# Patient Record
Sex: Female | Born: 1960 | Race: White | Hispanic: No | Marital: Married | Smoking: Current every day smoker
Health system: Southern US, Community
[De-identification: ages and names within clinical notes are randomized; demographics above are authoritative.]

## PROBLEM LIST (undated history)

## (undated) DIAGNOSIS — Q351 Cleft hard palate: Secondary | ICD-10-CM

## (undated) DIAGNOSIS — F411 Generalized anxiety disorder: Secondary | ICD-10-CM

## (undated) DIAGNOSIS — C44311 Basal cell carcinoma of skin of nose: Secondary | ICD-10-CM

## (undated) DIAGNOSIS — E669 Obesity, unspecified: Secondary | ICD-10-CM

## (undated) DIAGNOSIS — F329 Major depressive disorder, single episode, unspecified: Secondary | ICD-10-CM

## (undated) DIAGNOSIS — F319 Bipolar disorder, unspecified: Secondary | ICD-10-CM

## (undated) DIAGNOSIS — F32A Depression, unspecified: Secondary | ICD-10-CM

## (undated) HISTORY — DX: Depression, unspecified: F32.A

## (undated) HISTORY — PX: BREAST SURGERY: SHX581

## (undated) HISTORY — PX: CHOLECYSTECTOMY: SHX55

## (undated) HISTORY — DX: Generalized anxiety disorder: F41.1

## (undated) HISTORY — PX: CLEFT PALATE REPAIR: SUR1165

## (undated) HISTORY — DX: Obesity, unspecified: E66.9

## (undated) HISTORY — PX: SKIN CANCER DESTRUCTION: SHX778

## (undated) HISTORY — PX: ABDOMINAL HYSTERECTOMY: SHX81

## (undated) HISTORY — DX: Bipolar disorder, unspecified: F31.9

## (undated) HISTORY — DX: Basal cell carcinoma of skin of nose: C44.311

## (undated) HISTORY — PX: HERNIA REPAIR: SHX51

---
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
## 1999-11-05 ENCOUNTER — Emergency Department (HOSPITAL_COMMUNITY): Admission: EM | Admit: 1999-11-05 | Discharge: 1999-11-05 | Payer: Self-pay | Admitting: Emergency Medicine

## 1999-11-05 ENCOUNTER — Encounter: Payer: Self-pay | Admitting: Emergency Medicine

## 2001-07-03 ENCOUNTER — Emergency Department (HOSPITAL_COMMUNITY): Admission: EM | Admit: 2001-07-03 | Discharge: 2001-07-03 | Payer: Self-pay | Admitting: Emergency Medicine

## 2005-01-07 ENCOUNTER — Ambulatory Visit (HOSPITAL_COMMUNITY): Admission: RE | Admit: 2005-01-07 | Discharge: 2005-01-07 | Payer: Self-pay | Admitting: Obstetrics and Gynecology

## 2005-03-26 ENCOUNTER — Emergency Department (HOSPITAL_COMMUNITY): Admission: EM | Admit: 2005-03-26 | Discharge: 2005-03-27 | Payer: Self-pay | Admitting: Emergency Medicine

## 2006-03-16 ENCOUNTER — Emergency Department (HOSPITAL_COMMUNITY): Admission: EM | Admit: 2006-03-16 | Discharge: 2006-03-16 | Payer: Self-pay | Admitting: Emergency Medicine

## 2006-06-09 ENCOUNTER — Encounter (INDEPENDENT_AMBULATORY_CARE_PROVIDER_SITE_OTHER): Payer: Self-pay | Admitting: Specialist

## 2006-06-09 ENCOUNTER — Inpatient Hospital Stay (HOSPITAL_COMMUNITY): Admission: RE | Admit: 2006-06-09 | Discharge: 2006-06-11 | Payer: Self-pay | Admitting: Obstetrics and Gynecology

## 2006-08-26 ENCOUNTER — Inpatient Hospital Stay (HOSPITAL_COMMUNITY): Admission: AD | Admit: 2006-08-26 | Discharge: 2006-08-26 | Payer: Self-pay | Admitting: Obstetrics and Gynecology

## 2007-03-25 ENCOUNTER — Ambulatory Visit (HOSPITAL_COMMUNITY): Admission: RE | Admit: 2007-03-25 | Discharge: 2007-03-26 | Payer: Self-pay | Admitting: General Surgery

## 2008-07-30 ENCOUNTER — Emergency Department (HOSPITAL_COMMUNITY): Admission: EM | Admit: 2008-07-30 | Discharge: 2008-07-30 | Payer: Self-pay | Admitting: Emergency Medicine
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
## 2010-10-19 ENCOUNTER — Encounter: Payer: Self-pay | Admitting: Obstetrics and Gynecology

## 2010-10-19 ENCOUNTER — Encounter: Payer: Self-pay | Admitting: General Surgery

## 2011-02-10 NOTE — Op Note (Signed)
NAMEALVERA, TOURIGNY              ACCOUNT NO.:  [id number]   MEDICAL RECORD NO.:  [id number]          PATIENT TYPE:  OIB   LOCATION:  0098                         FACILITY:  [facility]   PHYSICIAN:  Angelia Mould. Derrell Lolling, M.D.DATE OF BIRTH:  [birth year]   DATE OF PROCEDURE:  03/25/2007  DATE OF DISCHARGE:                               OPERATIVE REPORT   PREOPERATIVE DIAGNOSIS:  Ventral incisional hernia.   POSTOPERATIVE DIAGNOSIS:  Ventral incisional hernia.   OPERATION PERFORMED:  Laparoscopic repair of ventral incisional hernia  with Parietex composite mesh (15 cm x 20 cm dimension).   SURGEON:  Angelia Mould. Derrell Lolling, M.D.   FIRST ASSISTANT:  Ollen Gross. Vernell Morgans, M.D.   OPERATIVE INDICATIONS:  This is a 50 year old white female who has had a  tubal ligation laparoscopically, a laparoscopic-assisted vaginal  hysterectomy and many years ago had an open cholecystectomy in 1985.  She has developed a symptomatic hernia centrally in the periumbilical  area which is painful and seems to have a defect about 3-4 cm in size.  There is no hernia in the right subcostal incision.  She is brought to  the operating room electively for repair of her periumbilical ventral  incisional hernia.   OPERATIVE TECHNIQUE:  Following induction of general endotracheal  anesthesia, the Foley catheter was inserted to empty the bladder.  Intravenous antibiotics were given.  The abdomen was prepped and draped  in sterile fashion.  The patient was identified as to correct patient  and correct procedure.  A 12 mm OptiVu port was placed in the left upper  quadrant.  This placement was uneventful.  Pneumoperitoneum was created.  Video camera was inserted.  There was no evidence of any bleeding or  trauma from this port insertion.  We ultimately placed a 5 mm trocar in  the left lower quadrant and two 5 mm trocars on the right lateral  abdominal wall.  On survey of the abdomen there was no abnormalities  other than about 4 cm  diameter incisional hernia centered around the  umbilicus into the right of the umbilicus.  There were a few adhesions  which were taken down.  We marked the edges of the hernia rim with a  spinal needle and then measured about 4 cm outside of that on all  dimensions and that gave [place] a 15 x 15 cm template.  We used a 15 cm x 20  cm piece of Parietex composite mesh.  This was brought to the operative  field.  This was placed in the abdominal wall and we drew a template for  suture fixation with six equidistant suture fixation sites around the  perimeter of the mesh.  We placed six interrupted sutures of #1 Novofil  at the predesignated markings.  We then moisten the mesh, rolled it up  and inserted into the abdominal cavity.  We opened the mesh up and  spread it out in the orientation that was marked.  Through tiny puncture  wounds, we drew the sutures through the abdominal wall at the six suture  fixation sites, being careful to get a 1  cm bite of fascia.  After all  the sutures were placed, we lifted the mesh up and we had good coverage  and almost no redundancy or draping of the mesh.  We tied all of these  sutures.  We then further secured the mesh to the abdominal wall with a  5 mm screw tacker.  We placed the screw tacks at the perimeter of the  mesh all the way around very close each other no more than 1 cm apart.  We placed several tacks centrally as well.  We had one area where the  screw tack cause bleeding in the right lower quadrant and we held  pressure on this and the bleeding seemed to stop.  I chose to place a  figure-of-eight suture of 0 Vicryl around this to be sure that it  stopped the bleeding and all the bleeding appeared to completely stop.  We checked everything that we did, everything seemed secure.  There were  no signs of any injury to any bowel.  The trocars were removed under  direct vision after releasing all of the pneumoperitoneum.  All the skin  incisions  were closed with subcuticular sutures of 4-0 Monocryl and  Steri-Strips.  Clean bandages were placed and the patient taken to  recovery room in stable condition.   ESTIMATED BLOOD LOSS:  About 15 mL.   COMPLICATIONS:  None.   Sponge, needle and instrument counts were correct.      Angelia Mould. Derrell Lolling, M.D.  Electronically Signed     HMI/MEDQ  D:  03/25/2007  T:  03/25/2007  Job:  [id number]   cc:   Malva Limes, M.D.  Fax: [phone]   Lenor Coffin, M.D.

## 2011-02-13 NOTE — Discharge Summary (Signed)
Pamela Collins, Pamela Collins              ACCOUNT NO.:  [id number]   MEDICAL RECORD NO.:  [id number]          PATIENT TYPE:  INP   LOCATION:  9317                          FACILITY:  [facility]   PHYSICIAN:  Malva Limes, M.D.    DATE OF BIRTH:  1961-04-19   DATE OF ADMISSION:  06/09/2006  DATE OF DISCHARGE:  06/11/2006                                 DISCHARGE SUMMARY   DISCHARGE DIAGNOSES:  1. Chronic pelvic pain.  2. Dyspareunia.  3. Dysmenorrhea.   PRINCIPAL PROCEDURES:  1. Laparoscopic assisted vaginal hysterectomy with bilateral salpingo-      oophorectomy.  2. Excision of a vulvar mass.   HISTORY OF PRESENT ILLNESS:  [name] is a 50 year old white female, G5,  P3-0-2-3 who presented to [facility] on June 09, 2006, for  laparoscopic assisted vaginal hysterectomy with bilateral salpingo-  oophorectomy and excision of a right vulvar mass.  The patient was having  this procedure secondary to a history of chronic pelvic pain, dyspareunia  and dysmenorrhea.  The patient was offered to a diagnostic laparoscopy and  declined.   HOSPITAL COURSE:  The patient underwent the above-listed procedures on  June 09, 2006, the complete description of this can be found in the  dictated operative note.  The patient's postoperative course was  uncomplicated.  The patient was discharged to home on postoperative day #2.  At that time she was eating a regular diet and ambulating without  difficulty.  The patient remained afebrile throughout the hospitalization.  She was noted to have a hematoma in the area of excision of the right vulvar  mass.  This will be followed up in the office.  The patient's pathology  revealed endocervical polyp and koilocytotic atypia of the cervix.  There  was also benign endometrial polyp in the endometrium.  The excised mass of  the vulva was a benign epidermal inclusion cyst.  The patient's preoperative  hemoglobin was 16.6 and postop 14.3.  The patient was  discharged to home  with Percocet to take as needed and Estrace 1 mg p.o. q.a.m.  She will  follow-up in the office in 4-6 days at which time the hematoma will be  evaluated.           ______________________________  Malva Limes, M.D.     MA/MEDQ  D:  07/12/2006  T:  07/13/2006  Job:  [id number]

## 2011-02-13 NOTE — Op Note (Signed)
Pamela Collins, Pamela Collins              ACCOUNT NO.:  [id number]   MEDICAL RECORD NO.:  [id number]          PATIENT TYPE:  INP   LOCATION:  9317                          FACILITY:  [facility]   PHYSICIAN:  Malva Limes, M.D.    DATE OF BIRTH:  1961-05-09   DATE OF PROCEDURE:  06/09/2006  DATE OF DISCHARGE:                                 OPERATIVE REPORT   PREOPERATIVE DIAGNOSIS:  1. Chronic pelvic pain.  2. Dyspareunia.  3. Dysmenorrhea.  4. Right labial mass.   POSTOP DIAGNOSIS:  1. Chronic pelvic pain.  2. Dyspareunia.  3. Dysmenorrhea.  4. Right labial mass.   PROCEDURE:  1. Laparoscopic-assisted vaginal hysterectomy, bilateral salpingo-      oophorectomy.  2. Excision of right vulvar mass.   SURGEON:  Dr. Dareen Piano   ASSISTANT:  Dr. Edward Jolly   ANESTHESIA:  General endotracheal.   ANTIBIOTICS:  Ancef 1 gram.   DRAINS:  Foley to bedside drainage   ESTIMATED BLOOD LOSS:  150 mL.   SPECIMENS:  Cervix and uterus, fallopian tubes and ovaries along with the  vulvar mass sent to pathology.   PROCEDURE:  The patient was taken to the operating room where she was placed  in the dorsal supine position and general anesthetic was administered  without complication.  She was then placed in dorsal lithotomy position.  She was prepped with Betadine and draped in the usual fashion for this  procedure.  The Hulka tenaculum applied to the anterior cervical lip.  Bladder was drained with red rubber catheter.  The umbilicus was injected  with 0.25% Marcaine. Vertical skin incision was made.  This was carried down  to fascia.  The fascia was grasped, entered, the parietal peritoneum was  then entered bluntly.  Sutures were placed laterally.  The Hasson cannula  was placed in the peritoneal cavity and 3 liters of carbon dioxide was  insufflated.  The patient was then placed Trendelenburg.  5-mm ports were  placed in the right and left lower quadrant under direct visualization.  Once this  accomplished the pelvis was examined.  There appeared to be no  evidence of any pelvic adhesions or endometriosis.  At this point the left  adnexa was grasped and infundibulopelvic ligament isolated, ureter  identified. Using the tripolar the infundibulopelvic ligament was then  transected. The round ligament was then cauterized and transected and the  superior aspect the broad ligament cauterized and transected to the level of  the uterine vessel. Similar procedure was performed on the opposite side. At  this point attention was turned to the vagina.  The posterior cul-de-sac was  entered sharply.  Uterosacral ligaments were bilaterally clamped, cut and  ligated with 0 Monocryl suture.  The cervix was then circumscribed, the  bladder pillars were bilaterally clamped, cut and ligated with 0 Monocryl  suture.  Cardinal ligaments were serially clamped, cut, ligated and ligated  with 0 Monocryl suture. Uterine vessels were bilaterally clamped, cut and  ligated with 0 Monocryl suture.  The uterus was then inverted and the  remaining broad ligament pedicles were bilaterally clamped, cut, and ligated  with 0 Monocryl suture.  The specimen removed and sent to pathology.  At  this point, pedicles were all checked and found to be hemostatic.  Following  this the vagina was closed using 2-0 Vicryl in a running locking fashion in  a vertical manner. At this point the mass on the right vulva was examined  and appeared to be approximately 2-3 cm in length and 1 cm in width. A  vertical skin incision was made.  The mass was excised and the skin incision  closed with 3-0 Rapide. At this point attention was placed back on the  umbilicus.  The abdominal cavity was insufflated again, pedicles were  checked and found to be hemostatic. At this point instruments were removed.  Pneumoperitoneum released.  The fascia was closed with interrupted 0 Vicryl  suture, then the skin with 3-0 Rapide. The 5 mm ports were  closed with  Dermabond.  The patient tolerated the procedure well.  She was extubated and  taken to the recovery room in stable condition.  Instrument and laparoscopy  counts correct x2.           ______________________________  Malva Limes, M.D.     MA/MEDQ  D:  06/09/2006  T:  06/10/2006  Job:  [id number]

## 2011-07-15 LAB — URINALYSIS, ROUTINE W REFLEX MICROSCOPIC
Bilirubin Urine: NEGATIVE
Glucose, UA: NEGATIVE
Nitrite: NEGATIVE
Protein, ur: NEGATIVE
Specific Gravity, Urine: 1.015

## 2011-07-15 LAB — DIFFERENTIAL
Eosinophils Relative: 3
Monocytes Absolute: 0.4
Neutrophils Relative %: 68

## 2011-07-15 LAB — COMPREHENSIVE METABOLIC PANEL
Albumin: 3.9
BUN: 11
CO2: 32
Calcium: 9.8
Chloride: 100
Creatinine, Ser: 0.66
GFR calc Af Amer: >60
Glucose, Bld: 95
Sodium: 142
Total Bilirubin: 0.6

## 2011-07-15 LAB — CBC
HCT: 50.5 — ABNORMAL HIGH
Hemoglobin: 17 — ABNORMAL HIGH
MCHC: 33.7
MCV: 88.1
RDW: 13.7

## 2019-03-27 ENCOUNTER — Other Ambulatory Visit: Payer: Self-pay

## 2019-03-27 ENCOUNTER — Ambulatory Visit
Admission: EM | Admit: 2019-03-27 | Discharge: 2019-03-27 | Disposition: A | Discharge status: Home / Self Care | Payer: Self-pay

## 2019-03-27 ENCOUNTER — Encounter: Payer: Self-pay | Admitting: Emergency Medicine

## 2019-03-27 DIAGNOSIS — F17200 Nicotine dependence, unspecified, uncomplicated: Secondary | ICD-10-CM

## 2019-03-27 DIAGNOSIS — J320 Chronic maxillary sinusitis: Secondary | ICD-10-CM

## 2019-03-27 DIAGNOSIS — B9689 Other specified bacterial agents as the cause of diseases classified elsewhere: Secondary | ICD-10-CM

## 2019-03-27 HISTORY — DX: Cleft hard palate: Q35.1

## 2019-03-27 MED ORDER — AMOXICILLIN-POT CLAVULANATE 875-125 MG PO TABS: 1.0000 | ORAL_TABLET | Freq: Two times a day (BID) | ORAL | 0 refills | Status: AC

## 2019-03-27 NOTE — ED Triage Notes (Signed)
Pt presents to [facility] for assessment of nasal congestion, chest congestion and productive cough (clear phlegm), head pressure.  Pt states Sudafed helps relieve most of her symptoms.  Pt states in the last 48 hours she has began having more congestion and pressure.

## 2019-03-27 NOTE — ED Provider Notes (Signed)
[facility]    CSN: [id number] Arrival date & time: 03/27/19  1352      History   Chief Complaint Chief Complaint  Patient presents with  . URI    HPI Angeni Chaudhuri is a 58 y.o. female.   The history is provided by the patient. No language interpreter was used.  URI Presenting symptoms: congestion, facial pain and rhinorrhea   Severity:  Moderate Onset quality:  Gradual Timing:  Constant Progression:  Worsening Relieved by:  Nothing Worsened by:  Nothing Ineffective treatments:  None tried Associated symptoms: sinus pain   Pt complains of sinus pressure and pain.  Pt states she has had symptoms for months.  Pt also states she is out of xanax and her MD retired.   Past Medical History:  Diagnosis Date  . Anxiety   . Cleft hard palate     There are no active problems to display for this patient.   Past Surgical History:  Procedure Laterality Date  . ABDOMINAL HYSTERECTOMY    . BREAST SURGERY    . CHOLECYSTECTOMY    . CLEFT PALATE REPAIR    . HERNIA REPAIR      OB History   No obstetric history on file.      Home Medications    Prior to Admission medications   Medication Sig Start Date End Date Taking? Authorizing Provider  ALPRAZolam Duanne Moron) 1 MG tablet Take 1 mg by mouth at bedtime as needed for anxiety.    [provider]  amoxicillin-clavulanate (AUGMENTIN) 875-125 MG tablet Take 1 tablet by mouth 2 (two) times daily for 10 days. 03/27/19 04/06/19  Fransico Meadow, PA-C    Family History History reviewed. No pertinent family history.  Social History Social History   Tobacco Use  . Smoking status: Current Every Day Smoker    Packs/day: 1.00  . Smokeless tobacco: Never Used  Substance Use Topics  . Alcohol use: Never    Frequency: Never  . Drug use: Never     Allergies   Doxycycline and Mucinex [guaifenesin er]   Review of Systems Review of Systems  HENT: Positive for congestion, rhinorrhea and sinus  pain.   All other systems reviewed and are negative.    Physical Exam Triage Vital Signs ED Triage Vitals  Enc Vitals Group     BP 03/27/19 1405 (!) 149/107     Pulse Rate 03/27/19 1405 100     Resp 03/27/19 1405 20     Temp 03/27/19 1405 (!) 97.2 F (36.2 C)     Temp Source 03/27/19 1405 Temporal     SpO2 03/27/19 1405 91 %     Weight --      Height --      Head Circumference --      Peak Flow --      Pain Score 03/27/19 1406 0     Pain Loc --      Pain Edu? --      Excl. in [place]? --    No data found.  Updated Vital Signs BP (!) 149/107 (BP Location: Left Arm)   Pulse 100   Temp (!) 97.2 F (36.2 C) (Temporal) Comment: 101.9, took Tylenol approx 3am  Resp 20   SpO2 91%   Visual Acuity Right Eye Distance:   Left Eye Distance:   Bilateral Distance:    Right Eye Near:   Left Eye Near:    Bilateral Near:     Physical Exam Constitutional:  Appearance: She is well-developed.  HENT:     Head: Normocephalic and atraumatic.     Right Ear: Tympanic membrane normal.     Left Ear: Tympanic membrane normal.     Nose: Nose normal.     Mouth/Throat:     Mouth: Mucous membranes are moist.     Pharynx: Posterior oropharyngeal erythema present.  Eyes:     Conjunctiva/sclera: Conjunctivae normal.     Pupils: Pupils are equal, round, and reactive to light.  Neck:     Musculoskeletal: Normal range of motion and neck supple.  Cardiovascular:     Rate and Rhythm: Normal rate.  Pulmonary:     Effort: Pulmonary effort is normal.  Abdominal:     Palpations: Abdomen is soft.  Musculoskeletal: Normal range of motion.  Skin:    General: Skin is warm and dry.  Neurological:     General: No focal deficit present.     Mental Status: She is alert and oriented to person, place, and time.  Psychiatric:        Mood and Affect: Mood normal.      UC Treatments / Results  Labs (all labs ordered are listed, but only abnormal results are displayed) Labs Reviewed - No data  to display  EKG None  Radiology No results found.  Procedures Procedures (including critical care time)  Medications Ordered in UC Medications - No data to display  Initial Impression / Assessment and Plan / UC Course  I have reviewed the triage vital signs and the nursing notes.  Pertinent labs & imaging results that were available during my care of the patient were reviewed by me and considered in my medical decision making (see chart for details).     Pt given referral to primary care for assessment and medication management.   Final Clinical Impressions(s) / UC Diagnoses   Final diagnoses:  Chronic maxillary sinusitis     Discharge Instructions     Return if any problems.    ED Prescriptions    Medication Sig Dispense Auth. Provider   amoxicillin-clavulanate (AUGMENTIN) 875-125 MG tablet Take 1 tablet by mouth 2 (two) times daily for 10 days. 20 tablet Fransico Meadow, [place]     Controlled Substance Prescriptions [place] Controlled Substance Registry consulted? Not Applicable   Fransico Meadow, [place] 03/27/19 1529

## 2019-03-27 NOTE — Discharge Instructions (Signed)
Return if any problems.

## 2019-03-27 NOTE — ED Notes (Signed)
Patient able to ambulate independently  

## 2019-04-07 ENCOUNTER — Encounter: Payer: Self-pay | Admitting: Family Medicine

## 2019-04-10 ENCOUNTER — Inpatient Hospital Stay: Payer: Self-pay | Admitting: Family Medicine

## 2022-01-28 DIAGNOSIS — R69 Illness, unspecified: Secondary | ICD-10-CM | POA: Diagnosis not present

## 2022-02-05 DIAGNOSIS — R69 Illness, unspecified: Secondary | ICD-10-CM | POA: Diagnosis not present

## 2022-03-02 DIAGNOSIS — R69 Illness, unspecified: Secondary | ICD-10-CM | POA: Diagnosis not present

## 2022-03-05 DIAGNOSIS — R69 Illness, unspecified: Secondary | ICD-10-CM | POA: Diagnosis not present

## 2022-03-09 DIAGNOSIS — R69 Illness, unspecified: Secondary | ICD-10-CM | POA: Diagnosis not present

## 2022-03-12 DIAGNOSIS — R69 Illness, unspecified: Secondary | ICD-10-CM | POA: Diagnosis not present

## 2022-03-16 DIAGNOSIS — R69 Illness, unspecified: Secondary | ICD-10-CM | POA: Diagnosis not present

## 2022-03-19 DIAGNOSIS — R69 Illness, unspecified: Secondary | ICD-10-CM | POA: Diagnosis not present

## 2022-03-23 DIAGNOSIS — R69 Illness, unspecified: Secondary | ICD-10-CM | POA: Diagnosis not present

## 2022-03-26 DIAGNOSIS — R69 Illness, unspecified: Secondary | ICD-10-CM | POA: Diagnosis not present

## 2022-04-07 DIAGNOSIS — R69 Illness, unspecified: Secondary | ICD-10-CM | POA: Diagnosis not present

## 2022-04-13 DIAGNOSIS — R69 Illness, unspecified: Secondary | ICD-10-CM | POA: Diagnosis not present

## 2022-04-20 DIAGNOSIS — R69 Illness, unspecified: Secondary | ICD-10-CM | POA: Diagnosis not present

## 2022-05-04 DIAGNOSIS — R69 Illness, unspecified: Secondary | ICD-10-CM | POA: Diagnosis not present

## 2022-05-11 DIAGNOSIS — R69 Illness, unspecified: Secondary | ICD-10-CM | POA: Diagnosis not present

## 2022-05-21 DIAGNOSIS — R69 Illness, unspecified: Secondary | ICD-10-CM | POA: Diagnosis not present

## 2022-05-25 DIAGNOSIS — R69 Illness, unspecified: Secondary | ICD-10-CM | POA: Diagnosis not present

## 2022-06-09 ENCOUNTER — Other Ambulatory Visit: Payer: Self-pay | Admitting: Internal Medicine

## 2022-06-09 ENCOUNTER — Ambulatory Visit
Admission: RE | Admit: 2022-06-09 | Discharge: 2022-06-09 | Disposition: A | Discharge status: Home / Self Care | Payer: 59 | Source: Ambulatory Visit | Attending: Internal Medicine | Admitting: Internal Medicine

## 2022-06-09 DIAGNOSIS — R946 Abnormal results of thyroid function studies: Secondary | ICD-10-CM | POA: Diagnosis not present

## 2022-06-09 DIAGNOSIS — R0602 Shortness of breath: Secondary | ICD-10-CM

## 2022-06-11 DIAGNOSIS — R69 Illness, unspecified: Secondary | ICD-10-CM | POA: Diagnosis not present

## 2022-06-17 ENCOUNTER — Other Ambulatory Visit (HOSPITAL_COMMUNITY): Payer: Self-pay | Admitting: Radiology

## 2022-06-17 DIAGNOSIS — R0602 Shortness of breath: Secondary | ICD-10-CM

## 2022-06-22 DIAGNOSIS — R69 Illness, unspecified: Secondary | ICD-10-CM | POA: Diagnosis not present

## 2022-07-01 ENCOUNTER — Ambulatory Visit
Admission: RE | Admit: 2022-07-01 | Discharge: 2022-07-01 | Disposition: A | Discharge status: Home / Self Care | Payer: 59 | Source: Ambulatory Visit | Attending: Internal Medicine | Admitting: Internal Medicine

## 2022-07-01 ENCOUNTER — Other Ambulatory Visit: Payer: Self-pay | Admitting: Internal Medicine

## 2022-07-01 DIAGNOSIS — S0033XA Contusion of nose, initial encounter: Secondary | ICD-10-CM | POA: Diagnosis not present

## 2022-07-01 DIAGNOSIS — K529 Noninfective gastroenteritis and colitis, unspecified: Secondary | ICD-10-CM | POA: Diagnosis not present

## 2022-07-01 DIAGNOSIS — S0992XA Unspecified injury of nose, initial encounter: Secondary | ICD-10-CM

## 2022-07-03 ENCOUNTER — Other Ambulatory Visit: Payer: Self-pay | Admitting: Internal Medicine

## 2022-07-03 DIAGNOSIS — Z1231 Encounter for screening mammogram for malignant neoplasm of breast: Secondary | ICD-10-CM

## 2022-07-06 ENCOUNTER — Inpatient Hospital Stay (HOSPITAL_COMMUNITY): Admission: RE | Admit: 2022-07-06 | Payer: 59 | Source: Ambulatory Visit

## 2022-07-13 DIAGNOSIS — R059 Cough, unspecified: Secondary | ICD-10-CM | POA: Diagnosis not present

## 2022-07-13 DIAGNOSIS — J029 Acute pharyngitis, unspecified: Secondary | ICD-10-CM | POA: Diagnosis not present

## 2022-07-13 DIAGNOSIS — Z03818 Encounter for observation for suspected exposure to other biological agents ruled out: Secondary | ICD-10-CM | POA: Diagnosis not present

## 2022-07-16 ENCOUNTER — Inpatient Hospital Stay (HOSPITAL_COMMUNITY): Admission: RE | Admit: 2022-07-16 | Payer: 59 | Source: Ambulatory Visit

## 2022-08-03 ENCOUNTER — Ambulatory Visit
Admission: RE | Admit: 2022-08-03 | Discharge: 2022-08-03 | Disposition: A | Discharge status: Home / Self Care | Payer: 59 | Source: Ambulatory Visit | Attending: Internal Medicine | Admitting: Internal Medicine

## 2022-08-03 DIAGNOSIS — Z1231 Encounter for screening mammogram for malignant neoplasm of breast: Secondary | ICD-10-CM

## 2022-08-28 DIAGNOSIS — Z419 Encounter for procedure for purposes other than remedying health state, unspecified: Secondary | ICD-10-CM | POA: Diagnosis not present

## 2022-09-28 DIAGNOSIS — Z419 Encounter for procedure for purposes other than remedying health state, unspecified: Secondary | ICD-10-CM | POA: Diagnosis not present

## 2022-09-29 DIAGNOSIS — F4310 Post-traumatic stress disorder, unspecified: Secondary | ICD-10-CM | POA: Diagnosis not present

## 2022-10-01 ENCOUNTER — Telehealth: Payer: Self-pay

## 2022-10-01 NOTE — Telephone Encounter (Signed)
Mychart msg sent. AS, CMA 

## 2022-10-13 DIAGNOSIS — F4310 Post-traumatic stress disorder, unspecified: Secondary | ICD-10-CM | POA: Diagnosis not present

## 2022-10-20 DIAGNOSIS — F4310 Post-traumatic stress disorder, unspecified: Secondary | ICD-10-CM | POA: Diagnosis not present

## 2022-10-27 DIAGNOSIS — F411 Generalized anxiety disorder: Secondary | ICD-10-CM | POA: Diagnosis not present

## 2022-10-29 DIAGNOSIS — Z419 Encounter for procedure for purposes other than remedying health state, unspecified: Secondary | ICD-10-CM | POA: Diagnosis not present

## 2022-11-03 DIAGNOSIS — F4310 Post-traumatic stress disorder, unspecified: Secondary | ICD-10-CM | POA: Diagnosis not present

## 2022-11-09 DIAGNOSIS — H6123 Impacted cerumen, bilateral: Secondary | ICD-10-CM | POA: Diagnosis not present

## 2022-11-09 DIAGNOSIS — Q359 Cleft palate, unspecified: Secondary | ICD-10-CM | POA: Diagnosis not present

## 2022-11-09 DIAGNOSIS — J329 Chronic sinusitis, unspecified: Secondary | ICD-10-CM | POA: Diagnosis not present

## 2022-11-10 DIAGNOSIS — F4310 Post-traumatic stress disorder, unspecified: Secondary | ICD-10-CM | POA: Diagnosis not present

## 2022-11-11 DIAGNOSIS — J321 Chronic frontal sinusitis: Secondary | ICD-10-CM | POA: Diagnosis not present

## 2022-11-11 DIAGNOSIS — J320 Chronic maxillary sinusitis: Secondary | ICD-10-CM | POA: Diagnosis not present

## 2022-11-11 DIAGNOSIS — S022XXA Fracture of nasal bones, initial encounter for closed fracture: Secondary | ICD-10-CM | POA: Diagnosis not present

## 2022-11-11 DIAGNOSIS — J342 Deviated nasal septum: Secondary | ICD-10-CM | POA: Diagnosis not present

## 2022-11-11 DIAGNOSIS — J329 Chronic sinusitis, unspecified: Secondary | ICD-10-CM | POA: Diagnosis not present

## 2022-11-17 DIAGNOSIS — F4310 Post-traumatic stress disorder, unspecified: Secondary | ICD-10-CM | POA: Diagnosis not present

## 2022-11-24 DIAGNOSIS — F4310 Post-traumatic stress disorder, unspecified: Secondary | ICD-10-CM | POA: Diagnosis not present

## 2022-11-25 DIAGNOSIS — M62830 Muscle spasm of back: Secondary | ICD-10-CM | POA: Diagnosis not present

## 2022-11-25 DIAGNOSIS — L0390 Cellulitis, unspecified: Secondary | ICD-10-CM | POA: Diagnosis not present

## 2022-11-27 DIAGNOSIS — Z419 Encounter for procedure for purposes other than remedying health state, unspecified: Secondary | ICD-10-CM | POA: Diagnosis not present

## 2022-12-01 DIAGNOSIS — F4310 Post-traumatic stress disorder, unspecified: Secondary | ICD-10-CM | POA: Diagnosis not present

## 2022-12-22 DIAGNOSIS — F4310 Post-traumatic stress disorder, unspecified: Secondary | ICD-10-CM | POA: Diagnosis not present

## 2022-12-28 DIAGNOSIS — Z419 Encounter for procedure for purposes other than remedying health state, unspecified: Secondary | ICD-10-CM | POA: Diagnosis not present

## 2022-12-29 DIAGNOSIS — F4310 Post-traumatic stress disorder, unspecified: Secondary | ICD-10-CM | POA: Diagnosis not present

## 2023-01-05 DIAGNOSIS — F4310 Post-traumatic stress disorder, unspecified: Secondary | ICD-10-CM | POA: Diagnosis not present

## 2023-01-08 DIAGNOSIS — Z13220 Encounter for screening for lipoid disorders: Secondary | ICD-10-CM | POA: Diagnosis not present

## 2023-01-08 DIAGNOSIS — K529 Noninfective gastroenteritis and colitis, unspecified: Secondary | ICD-10-CM | POA: Diagnosis not present

## 2023-01-12 ENCOUNTER — Other Ambulatory Visit: Payer: Self-pay | Admitting: Internal Medicine

## 2023-01-12 DIAGNOSIS — F17200 Nicotine dependence, unspecified, uncomplicated: Secondary | ICD-10-CM

## 2023-01-14 DIAGNOSIS — F411 Generalized anxiety disorder: Secondary | ICD-10-CM | POA: Diagnosis not present

## 2023-01-19 ENCOUNTER — Encounter: Payer: Self-pay | Admitting: Internal Medicine

## 2023-01-19 ENCOUNTER — Other Ambulatory Visit: Payer: Self-pay | Admitting: Internal Medicine

## 2023-01-19 DIAGNOSIS — R109 Unspecified abdominal pain: Secondary | ICD-10-CM

## 2023-01-25 ENCOUNTER — Inpatient Hospital Stay (HOSPITAL_COMMUNITY): Admission: RE | Admit: 2023-01-25 | Payer: 59 | Source: Ambulatory Visit

## 2023-01-26 DIAGNOSIS — F4310 Post-traumatic stress disorder, unspecified: Secondary | ICD-10-CM | POA: Diagnosis not present

## 2023-02-02 DIAGNOSIS — F4310 Post-traumatic stress disorder, unspecified: Secondary | ICD-10-CM | POA: Diagnosis not present

## 2023-02-10 ENCOUNTER — Encounter: Payer: Self-pay | Admitting: Internal Medicine

## 2023-02-12 ENCOUNTER — Other Ambulatory Visit: Payer: 59

## 2023-02-12 ENCOUNTER — Inpatient Hospital Stay: Admission: RE | Admit: 2023-02-12 | Payer: 59 | Source: Ambulatory Visit

## 2023-02-16 DIAGNOSIS — F4310 Post-traumatic stress disorder, unspecified: Secondary | ICD-10-CM | POA: Diagnosis not present

## 2023-03-16 ENCOUNTER — Other Ambulatory Visit: Payer: 59

## 2023-03-16 ENCOUNTER — Inpatient Hospital Stay: Admission: RE | Admit: 2023-03-16 | Payer: 59 | Source: Ambulatory Visit

## 2023-03-17 ENCOUNTER — Ambulatory Visit
Admission: RE | Admit: 2023-03-17 | Discharge: 2023-03-17 | Disposition: A | Discharge status: Home / Self Care | Payer: 59 | Source: Ambulatory Visit | Attending: Internal Medicine | Admitting: Internal Medicine

## 2023-03-17 DIAGNOSIS — R109 Unspecified abdominal pain: Secondary | ICD-10-CM | POA: Diagnosis not present

## 2023-03-17 DIAGNOSIS — I700 Atherosclerosis of aorta: Secondary | ICD-10-CM | POA: Diagnosis not present

## 2023-03-17 DIAGNOSIS — J439 Emphysema, unspecified: Secondary | ICD-10-CM | POA: Diagnosis not present

## 2023-03-17 DIAGNOSIS — M5136 Other intervertebral disc degeneration, lumbar region: Secondary | ICD-10-CM | POA: Diagnosis not present

## 2023-03-17 DIAGNOSIS — K588 Other irritable bowel syndrome: Secondary | ICD-10-CM | POA: Diagnosis not present

## 2023-03-17 DIAGNOSIS — F17200 Nicotine dependence, unspecified, uncomplicated: Secondary | ICD-10-CM

## 2023-03-17 MED ORDER — IOPAMIDOL (ISOVUE-300) INJECTION 61%
100.0000 mL | Freq: Once | INTRAVENOUS | Status: AC | PRN
  Administered 2023-03-17: 100 mL via INTRAVENOUS

## 2023-03-18 DIAGNOSIS — J342 Deviated nasal septum: Secondary | ICD-10-CM | POA: Diagnosis not present

## 2023-03-18 DIAGNOSIS — J324 Chronic pansinusitis: Secondary | ICD-10-CM | POA: Diagnosis not present

## 2023-03-19 DIAGNOSIS — M545 Low back pain, unspecified: Secondary | ICD-10-CM | POA: Diagnosis not present

## 2023-03-19 DIAGNOSIS — M546 Pain in thoracic spine: Secondary | ICD-10-CM | POA: Diagnosis not present

## 2023-03-30 ENCOUNTER — Ambulatory Visit (HOSPITAL_COMMUNITY)
Admission: RE | Admit: 2023-03-30 | Discharge: 2023-03-30 | Disposition: A | Discharge status: Home / Self Care | Payer: 59 | Source: Ambulatory Visit | Attending: Internal Medicine | Admitting: Internal Medicine

## 2023-03-30 DIAGNOSIS — R0602 Shortness of breath: Secondary | ICD-10-CM | POA: Insufficient documentation

## 2023-03-30 DIAGNOSIS — F4310 Post-traumatic stress disorder, unspecified: Secondary | ICD-10-CM | POA: Diagnosis not present

## 2023-03-30 LAB — PULMONARY FUNCTION TEST
DL/VA % pred: 93 %
DL/VA: 3.96 ml/min/mmHg/L
DLCO unc % pred: 76 %
DLCO unc: 14.61 ml/min/mmHg
FEF 25-75 Post: 2.62 L/sec
FEF 25-75 Pre: 3.08 L/sec
FEF2575-%Change-Post: -14 %
FEF2575-%Pred-Post: 120 %
FEF2575-%Pred-Pre: 141 %
FEV1-%Change-Post: -1 %
FEV1-%Pred-Post: 84 %
FEV1-%Pred-Pre: 85 %
FEV1-Post: 2 L
FEV1-Pre: 2.02 L
FEV1FVC-%Change-Post: 0 %
FEV1FVC-%Pred-Pre: 112 %
FEV6-%Change-Post: 0 %
FEV6-%Pred-Post: 77 %
FEV6-%Pred-Pre: 78 %
FEV6-Post: 2.29 L
FEV6-Pre: 2.31 L
FEV6FVC-%Pred-Post: 103 %
FEV6FVC-%Pred-Pre: 103 %
FVC-%Change-Post: 0 %
FVC-%Pred-Post: 74 %
FVC-%Pred-Pre: 75 %
FVC-Post: 2.29 L
FVC-Pre: 2.31 L
Post FEV1/FVC ratio: 87 %
Post FEV6/FVC ratio: 100 %
Pre FEV1/FVC ratio: 88 %
Pre FEV6/FVC Ratio: 100 %
RV % pred: 119 %
RV: 2.33 L
TLC % pred: 92 %
TLC: 4.49 L

## 2023-03-30 MED ORDER — ALBUTEROL SULFATE (2.5 MG/3ML) 0.083% IN NEBU
2.5000 mg | INHALATION_SOLUTION | Freq: Once | RESPIRATORY_TRACT | Status: AC
  Administered 2023-03-30: 2.5 mg via RESPIRATORY_TRACT

## 2023-04-05 DIAGNOSIS — L728 Other follicular cysts of the skin and subcutaneous tissue: Secondary | ICD-10-CM | POA: Diagnosis not present

## 2023-04-05 DIAGNOSIS — L298 Other pruritus: Secondary | ICD-10-CM | POA: Diagnosis not present

## 2023-04-05 DIAGNOSIS — R208 Other disturbances of skin sensation: Secondary | ICD-10-CM | POA: Diagnosis not present

## 2023-04-05 DIAGNOSIS — C44321 Squamous cell carcinoma of skin of nose: Secondary | ICD-10-CM | POA: Diagnosis not present

## 2023-04-05 DIAGNOSIS — D485 Neoplasm of uncertain behavior of skin: Secondary | ICD-10-CM | POA: Diagnosis not present

## 2023-04-05 DIAGNOSIS — L538 Other specified erythematous conditions: Secondary | ICD-10-CM | POA: Diagnosis not present

## 2023-04-05 DIAGNOSIS — C44519 Basal cell carcinoma of skin of other part of trunk: Secondary | ICD-10-CM | POA: Diagnosis not present

## 2023-04-05 DIAGNOSIS — D225 Melanocytic nevi of trunk: Secondary | ICD-10-CM | POA: Diagnosis not present

## 2023-04-05 DIAGNOSIS — Z789 Other specified health status: Secondary | ICD-10-CM | POA: Diagnosis not present

## 2023-04-06 DIAGNOSIS — F4310 Post-traumatic stress disorder, unspecified: Secondary | ICD-10-CM | POA: Diagnosis not present

## 2023-04-09 DIAGNOSIS — J324 Chronic pansinusitis: Secondary | ICD-10-CM | POA: Diagnosis not present

## 2023-04-09 DIAGNOSIS — J342 Deviated nasal septum: Secondary | ICD-10-CM | POA: Diagnosis not present

## 2023-04-14 DIAGNOSIS — D0439 Carcinoma in situ of skin of other parts of face: Secondary | ICD-10-CM | POA: Diagnosis not present

## 2023-04-14 DIAGNOSIS — C44519 Basal cell carcinoma of skin of other part of trunk: Secondary | ICD-10-CM | POA: Diagnosis not present

## 2023-04-15 DIAGNOSIS — F411 Generalized anxiety disorder: Secondary | ICD-10-CM | POA: Diagnosis not present

## 2023-04-20 DIAGNOSIS — F4310 Post-traumatic stress disorder, unspecified: Secondary | ICD-10-CM | POA: Diagnosis not present

## 2023-05-18 DIAGNOSIS — F4310 Post-traumatic stress disorder, unspecified: Secondary | ICD-10-CM | POA: Diagnosis not present

## 2023-06-02 DIAGNOSIS — Z03818 Encounter for observation for suspected exposure to other biological agents ruled out: Secondary | ICD-10-CM | POA: Diagnosis not present

## 2023-06-02 DIAGNOSIS — R509 Fever, unspecified: Secondary | ICD-10-CM | POA: Diagnosis not present

## 2023-06-04 DIAGNOSIS — F4310 Post-traumatic stress disorder, unspecified: Secondary | ICD-10-CM | POA: Diagnosis not present

## 2023-06-24 DIAGNOSIS — F411 Generalized anxiety disorder: Secondary | ICD-10-CM | POA: Diagnosis not present

## 2023-07-05 DIAGNOSIS — K5730 Diverticulosis of large intestine without perforation or abscess without bleeding: Secondary | ICD-10-CM | POA: Diagnosis not present

## 2023-07-05 DIAGNOSIS — D120 Benign neoplasm of cecum: Secondary | ICD-10-CM | POA: Diagnosis not present

## 2023-07-05 DIAGNOSIS — Z1211 Encounter for screening for malignant neoplasm of colon: Secondary | ICD-10-CM | POA: Diagnosis not present

## 2023-07-07 DIAGNOSIS — R109 Unspecified abdominal pain: Secondary | ICD-10-CM | POA: Diagnosis not present

## 2023-07-07 DIAGNOSIS — R0602 Shortness of breath: Secondary | ICD-10-CM | POA: Diagnosis not present

## 2023-07-07 DIAGNOSIS — F411 Generalized anxiety disorder: Secondary | ICD-10-CM | POA: Diagnosis not present

## 2023-07-07 DIAGNOSIS — F17200 Nicotine dependence, unspecified, uncomplicated: Secondary | ICD-10-CM | POA: Diagnosis not present

## 2023-07-27 DIAGNOSIS — F4310 Post-traumatic stress disorder, unspecified: Secondary | ICD-10-CM | POA: Diagnosis not present

## 2023-08-03 DIAGNOSIS — F4310 Post-traumatic stress disorder, unspecified: Secondary | ICD-10-CM | POA: Diagnosis not present

## 2023-08-10 DIAGNOSIS — F4310 Post-traumatic stress disorder, unspecified: Secondary | ICD-10-CM | POA: Diagnosis not present

## 2023-08-11 ENCOUNTER — Other Ambulatory Visit: Payer: Self-pay | Admitting: Internal Medicine

## 2023-08-11 DIAGNOSIS — N6310 Unspecified lump in the right breast, unspecified quadrant: Secondary | ICD-10-CM

## 2023-08-17 DIAGNOSIS — F4310 Post-traumatic stress disorder, unspecified: Secondary | ICD-10-CM | POA: Diagnosis not present

## 2023-08-19 ENCOUNTER — Other Ambulatory Visit (HOSPITAL_BASED_OUTPATIENT_CLINIC_OR_DEPARTMENT_OTHER): Payer: Self-pay

## 2023-08-19 ENCOUNTER — Emergency Department (HOSPITAL_BASED_OUTPATIENT_CLINIC_OR_DEPARTMENT_OTHER): Payer: 59 | Admitting: Radiology

## 2023-08-19 ENCOUNTER — Emergency Department (HOSPITAL_BASED_OUTPATIENT_CLINIC_OR_DEPARTMENT_OTHER): Payer: 59

## 2023-08-19 ENCOUNTER — Other Ambulatory Visit: Payer: Self-pay

## 2023-08-19 ENCOUNTER — Emergency Department (HOSPITAL_BASED_OUTPATIENT_CLINIC_OR_DEPARTMENT_OTHER)
Admission: EM | Admit: 2023-08-19 | Discharge: 2023-08-19 | Disposition: A | Discharge status: Home / Self Care | Payer: 59 | Attending: Emergency Medicine | Admitting: Emergency Medicine

## 2023-08-19 ENCOUNTER — Ambulatory Visit
Admission: EM | Admit: 2023-08-19 | Discharge: 2023-08-19 | Disposition: A | Discharge status: Home / Self Care | Payer: 59 | Attending: Family Medicine | Admitting: Family Medicine

## 2023-08-19 DIAGNOSIS — S42215A Unspecified nondisplaced fracture of surgical neck of left humerus, initial encounter for closed fracture: Secondary | ICD-10-CM | POA: Insufficient documentation

## 2023-08-19 DIAGNOSIS — M79632 Pain in left forearm: Secondary | ICD-10-CM | POA: Diagnosis not present

## 2023-08-19 DIAGNOSIS — I6782 Cerebral ischemia: Secondary | ICD-10-CM | POA: Diagnosis not present

## 2023-08-19 DIAGNOSIS — S42295A Other nondisplaced fracture of upper end of left humerus, initial encounter for closed fracture: Secondary | ICD-10-CM | POA: Diagnosis not present

## 2023-08-19 DIAGNOSIS — R220 Localized swelling, mass and lump, head: Secondary | ICD-10-CM | POA: Diagnosis not present

## 2023-08-19 DIAGNOSIS — R918 Other nonspecific abnormal finding of lung field: Secondary | ICD-10-CM | POA: Diagnosis not present

## 2023-08-19 DIAGNOSIS — M1711 Unilateral primary osteoarthritis, right knee: Secondary | ICD-10-CM | POA: Diagnosis not present

## 2023-08-19 DIAGNOSIS — N200 Calculus of kidney: Secondary | ICD-10-CM | POA: Diagnosis not present

## 2023-08-19 DIAGNOSIS — J439 Emphysema, unspecified: Secondary | ICD-10-CM | POA: Diagnosis not present

## 2023-08-19 DIAGNOSIS — M25512 Pain in left shoulder: Secondary | ICD-10-CM

## 2023-08-19 DIAGNOSIS — M19012 Primary osteoarthritis, left shoulder: Secondary | ICD-10-CM | POA: Diagnosis not present

## 2023-08-19 DIAGNOSIS — W19XXXA Unspecified fall, initial encounter: Secondary | ICD-10-CM

## 2023-08-19 DIAGNOSIS — M25511 Pain in right shoulder: Secondary | ICD-10-CM | POA: Diagnosis not present

## 2023-08-19 DIAGNOSIS — R9431 Abnormal electrocardiogram [ECG] [EKG]: Secondary | ICD-10-CM | POA: Diagnosis not present

## 2023-08-19 DIAGNOSIS — R42 Dizziness and giddiness: Secondary | ICD-10-CM

## 2023-08-19 DIAGNOSIS — M25522 Pain in left elbow: Secondary | ICD-10-CM | POA: Diagnosis not present

## 2023-08-19 DIAGNOSIS — M25561 Pain in right knee: Secondary | ICD-10-CM | POA: Diagnosis not present

## 2023-08-19 DIAGNOSIS — S0990XA Unspecified injury of head, initial encounter: Secondary | ICD-10-CM

## 2023-08-19 DIAGNOSIS — F411 Generalized anxiety disorder: Secondary | ICD-10-CM | POA: Diagnosis not present

## 2023-08-19 DIAGNOSIS — W010XXA Fall on same level from slipping, tripping and stumbling without subsequent striking against object, initial encounter: Secondary | ICD-10-CM | POA: Insufficient documentation

## 2023-08-19 DIAGNOSIS — S199XXA Unspecified injury of neck, initial encounter: Secondary | ICD-10-CM | POA: Diagnosis not present

## 2023-08-19 DIAGNOSIS — S42202A Unspecified fracture of upper end of left humerus, initial encounter for closed fracture: Secondary | ICD-10-CM | POA: Diagnosis not present

## 2023-08-19 DIAGNOSIS — R10819 Abdominal tenderness, unspecified site: Secondary | ICD-10-CM | POA: Insufficient documentation

## 2023-08-19 DIAGNOSIS — R0789 Other chest pain: Secondary | ICD-10-CM | POA: Insufficient documentation

## 2023-08-19 DIAGNOSIS — S4290XA Fracture of unspecified shoulder girdle, part unspecified, initial encounter for closed fracture: Secondary | ICD-10-CM | POA: Diagnosis not present

## 2023-08-19 DIAGNOSIS — S43001A Unspecified subluxation of right shoulder joint, initial encounter: Secondary | ICD-10-CM | POA: Diagnosis not present

## 2023-08-19 LAB — I-STAT VENOUS BLOOD GAS, ED
Acid-Base Excess: 2 mmol/L (ref 0.0–2.0)
Bicarbonate: 28.9 mmol/L — ABNORMAL HIGH (ref 20.0–28.0)
Calcium, Ion: 1.22 mmol/L (ref 1.15–1.40)
HCT: 49 % — ABNORMAL HIGH (ref 36.0–46.0)
Hemoglobin: 16.7 g/dL — ABNORMAL HIGH (ref 12.0–15.0)
O2 Saturation: 24 %
Potassium: 4.2 mmol/L (ref 3.5–5.1)
Sodium: 137 mmol/L (ref 135–145)
TCO2: 30 mmol/L (ref 22–32)
pCO2, Ven: 52.9 mm[Hg] (ref 44–60)
pH, Ven: 7.345 (ref 7.25–7.43)
pO2, Ven: 18 mm[Hg] — CL (ref 32–45)

## 2023-08-19 LAB — COMPREHENSIVE METABOLIC PANEL
ALT: 17 U/L (ref 0–44)
AST: 19 U/L (ref 15–41)
Albumin: 4.9 g/dL (ref 3.5–5.0)
Alkaline Phosphatase: 76 U/L (ref 38–126)
Anion gap: 9 (ref 5–15)
BUN: 19 mg/dL (ref 8–23)
CO2: 29 mmol/L (ref 22–32)
Calcium: 10.3 mg/dL (ref 8.9–10.3)
Chloride: 99 mmol/L (ref 98–111)
Creatinine, Ser: 0.71 mg/dL (ref 0.44–1.00)
GFR, Estimated: >60 mL/min (ref >60)
Glucose, Bld: 105 mg/dL — ABNORMAL HIGH (ref 70–99)
Potassium: 4.1 mmol/L (ref 3.5–5.1)
Sodium: 137 mmol/L (ref 135–145)
Total Bilirubin: 0.6 mg/dL (ref <1.2)
Total Protein: 7.9 g/dL (ref 6.5–8.1)

## 2023-08-19 MED ORDER — CELECOXIB 200 MG PO CAPS
200.0000 mg | ORAL_CAPSULE | Freq: Two times a day (BID) | ORAL | 0 refills | Status: AC | PRN
  Filled 2023-08-19: qty 30, 15d supply, fill #0

## 2023-08-19 MED ORDER — FENTANYL CITRATE PF 50 MCG/ML IJ SOSY
25.0000 ug | PREFILLED_SYRINGE | Freq: Once | INTRAMUSCULAR | Status: AC
  Administered 2023-08-19: 25 ug via INTRAVENOUS
  Filled 2023-08-19: qty 1

## 2023-08-19 MED ORDER — IBUPROFEN 600 MG PO TABS
600.0000 mg | ORAL_TABLET | Freq: Four times a day (QID) | ORAL | 0 refills | Status: DC | PRN
  Filled 2023-08-19: qty 30, 8d supply, fill #0

## 2023-08-19 MED ORDER — IOHEXOL 300 MG/ML  SOLN
100.0000 mL | Freq: Once | INTRAMUSCULAR | Status: AC | PRN
  Administered 2023-08-19: 85 mL via INTRAVENOUS

## 2023-08-19 NOTE — ED Notes (Signed)
I-Stat performed by RRT

## 2023-08-19 NOTE — ED Triage Notes (Signed)
Tripped over dog. Fell onto left arm, hit head, right knee, left ribs. +LOC. No thinners. Seen at [facility].

## 2023-08-19 NOTE — ED Provider Notes (Signed)
[facility]    CSN: [id number] Arrival date & time: 08/19/23  1329      History   Chief Complaint No chief complaint on file.   HPI Pamela Collins is a 62 y.o. female.   Patient is here after a fall.  She tripped over there dog.  She fell mostly on the left side.  Having pain at the left shoulder, elbow, tingling in her fingers.  Right knee as well.  Her left ribs are hurting.  She did hit her head and blacked out. She thinks she was out for about 5-10 mins.  She is having dizziness and severe headache.  She is having a neck pain as well.  No n/v.  Some blurry vision.        Past Medical History:  Diagnosis Date   Basal cell carcinoma of nose    Bipolar I disorder (HCC)    Cleft hard palate    Depression    GAD (generalized anxiety disorder)    Obesity     There are no problems to display for this patient.   Past Surgical History:  Procedure Laterality Date   ABDOMINAL HYSTERECTOMY     BREAST SURGERY     CHOLECYSTECTOMY     CLEFT PALATE REPAIR     19   HERNIA REPAIR     SKIN CANCER DESTRUCTION      OB History   No obstetric history on file.      Home Medications    Prior to Admission medications   Medication Sig Start Date End Date Taking? Authorizing Provider  ALPRAZolam Prudy Feeler) 1 MG tablet Take 1 mg by mouth 3 (three) times daily as needed for anxiety.    [provider]  QUEtiapine (SEROQUEL) 200 MG tablet TAKE 3 TABLETS BY MOUTH AT BEDTIME 06/06/18   [provider]    Family History Family History  Problem Relation Age of Onset   Bipolar disorder Mother    Breast cancer Maternal Aunt    Breast cancer Maternal Aunt    Breast cancer Maternal Aunt    Breast cancer Paternal Aunt    Breast cancer Paternal Aunt    Mental illness Maternal Grandmother    Bipolar disorder Son     Social History Social History   Tobacco Use   Smoking status: Every Day    Current packs/day: 1.00    Types: Cigarettes    Smokeless tobacco: Never  Substance Use Topics   Alcohol use: Not Currently   Drug use: Never     Allergies   Guaifenesin er, Cefdinir, Codeine, and Doxycycline   Review of Systems Review of Systems  Constitutional: Negative.   HENT: Negative.    Respiratory: Negative.    Cardiovascular: Negative.   Gastrointestinal: Negative.   Musculoskeletal:  Positive for arthralgias and myalgias.  Neurological:  Positive for dizziness, light-headedness and headaches.  Hematological: Negative.      Physical Exam Triage Vital Signs ED Triage Vitals  Encounter Vitals Group     BP 08/19/23 1341 113/75     Systolic BP Percentile --      Diastolic BP Percentile --      Pulse Rate 08/19/23 1341 64     Resp 08/19/23 1341 20     Temp 08/19/23 1341 97.6 F (36.4 C)     Temp Source 08/19/23 1341 Oral     SpO2 08/19/23 1341 100 %     Weight 08/19/23 1345 158 lb (71.7 kg)  Height 08/19/23 1345 5\' 3"  (1.6 m)     Head Circumference --      Peak Flow --      Pain Score --      Pain Loc --      Pain Education --      Exclude from Growth Chart --    No data found.  Updated Vital Signs BP 113/75 (BP Location: Left Arm)   Pulse 64   Temp 97.6 F (36.4 C) (Oral)   Resp 20   Ht 5\' 3"  (1.6 m)   Wt 71.7 kg   SpO2 100%   BMI 27.99 kg/m   Visual Acuity Right Eye Distance:   Left Eye Distance:   Bilateral Distance:    Right Eye Near:   Left Eye Near:    Bilateral Near:     Physical Exam Constitutional:      Appearance: Normal appearance.  Cardiovascular:     Rate and Rhythm: Normal rate and regular rhythm.  Pulmonary:     Effort: Pulmonary effort is normal.     Breath sounds: Normal breath sounds.  Musculoskeletal:     Comments: She holds the left arm near her body and not moving it.  TTP to the cervical spine;  decreased ROM of the neck due to pain.  TTP to the left shoulder, arm, elbow;  decreased rom to the shoulder due to pain  Neurological:     General: No focal  deficit present.     Mental Status: She is alert and oriented to person, place, and time.     Motor: No weakness.     Comments: + light sensative  Psychiatric:        Mood and Affect: Mood normal.      UC Treatments / Results  Labs (all labs ordered are listed, but only abnormal results are displayed) Labs Reviewed - No data to display  EKG   Radiology No results found.  Procedures Procedures (including critical care time)  Medications Ordered in UC Medications - No data to display  Initial Impression / Assessment and Plan / UC Course  I have reviewed the triage vital signs and the nursing notes.  Pertinent labs & imaging results that were available during my care of the patient were reviewed by me and considered in my medical decision making (see chart for details).   Final Clinical Impressions(s) / UC Diagnoses   Final diagnoses:  Injury of head, initial encounter  Acute pain of left shoulder  Left elbow pain  Dizziness  Fall, initial encounter     Discharge Instructions      You were seen today after a fall.  Given that you blacked out, and are having dizziness and headache I recommend you go to the ER for further evaluation.     ED Prescriptions   None    PDMP not reviewed this encounter.   Jannifer Franklin, MD 08/19/23 [phone]

## 2023-08-19 NOTE — ED Triage Notes (Signed)
Patient states she tripped over her dog about a half hour ago, hit her head, left shoulder, left elbow, left ribs up to bra strap hurts, "very lightheaded and dizziness" States she blacked out when she fell and have the trembles after fall.

## 2023-08-19 NOTE — Discharge Instructions (Signed)
You were seen today after a fall.  Given that you blacked out, and are having dizziness and headache I recommend you go to the ER for further evaluation.

## 2023-08-19 NOTE — Discharge Instructions (Addendum)
As discussed, workup today overall reassuring.  You did have evidence of left-sided shoulder fracture.  We placed in a sling while in the emergency department.  Wear sling until follow-up with orthopedics in the outpatient setting.  Attached is information for orthopedic doctor on-call.  Recommend rest, ice, anti-inflammatories in the form of ibuprofen/meloxicam/Aleve for your symptoms.  Please do not hesitate to return if the worrisome signs and symptoms we discussed become apparent.

## 2023-08-19 NOTE — ED Notes (Signed)
Patient is being discharged from the Urgent Care and sent to the Emergency Department via private vehicle . Per Dr Haynes Bast, patient is in need of higher level of care due to fall/near syncope. Patient is aware and verbalizes understanding of plan of care.  Vitals:   08/19/23 1341  BP: 113/75  Pulse: 64  Resp: 20  Temp: 97.6 F (36.4 C)  SpO2: 100%

## 2023-08-19 NOTE — ED Notes (Signed)
Discharge paperwork given and verbally understood. 

## 2023-08-19 NOTE — ED Notes (Signed)
Patient has sling on is leaving with gown on. Patient is waiting on discharge at this time.

## 2023-08-19 NOTE — ED Provider Notes (Signed)
[place] EMERGENCY DEPARTMENT AT [facility] Provider Note   CSN: [id number] Arrival date & time: 08/19/23  1439     History  Chief Complaint  Patient presents with   Pamela Collins Pamela Collins is a 62 y.o. female.  HPI   62 year old female presents emergency department after a fall.  Patient reports mechanical fall earlier this morning when he tripped over a dog in the kitchen.  Reports falling on her left side but did hit her right knee first.  Reports hitting her head on the ground and feels like she blacked out.  Patient states that she was aware of her surroundings and could hear her husband but could not see for a few minutes.  She does symptoms went away before returning to baseline.  States that she is having left-sided chest pain, left arm pain, right shoulder pain, right knee pain as well as some left-sided flank pain from fall.  Denies blood thinner use.  Reports blurry vision but no change in her baseline as she is not wearing her glasses.  Denies any slurred speech from baseline, facial droop, weakness/sensory deficits in upper lower extremities.  Denies any shortness of breath, nausea, vomiting.  Past medical history significant for GAD, bipolar 1 disorder, basal cell carcinoma  Home Medications Prior to Admission medications   Medication Sig Start Date End Date Taking? Authorizing Provider  celecoxib (CELEBREX) 200 MG capsule Take 1 capsule (200 mg total) by mouth 2 (two) times daily as needed. 08/19/23  Yes Sherian Maroon A, PA  traZODone (DESYREL) 100 MG tablet Take 100 mg by mouth at bedtime. 08/17/23  Yes [provider]  ALPRAZolam Prudy Feeler) 1 MG tablet Take 1 mg by mouth 3 (three) times daily as needed for anxiety.    [provider]  busPIRone (BUSPAR) 15 MG tablet Take 15 mg by mouth 3 (three) times daily.    [provider]  meloxicam (MOBIC) 15 MG tablet Take 15 mg by mouth daily.    [provider]   sertraline (ZOLOFT) 100 MG tablet Take 100 mg by mouth daily.    [provider]      Allergies    Guaifenesin er, Cefdinir, Codeine, and Doxycycline    Review of Systems   Review of Systems  All other systems reviewed and are negative.   Physical Exam Updated Vital Signs BP 107/63   Pulse 73   Temp 98.7 F (37.1 C) (Oral)   Resp 17   SpO2 97%  Physical Exam Vitals and nursing note reviewed.  Constitutional:      General: She is not in acute distress.    Appearance: She is well-developed.  HENT:     Head: Normocephalic and atraumatic.  Eyes:     Conjunctiva/sclera: Conjunctivae normal.  Cardiovascular:     Rate and Rhythm: Normal rate and regular rhythm.     Heart sounds: No murmur heard. Pulmonary:     Effort: Pulmonary effort is normal. No respiratory distress.     Breath sounds: Normal breath sounds.  Abdominal:     Palpations: Abdomen is soft.     Tenderness: There is abdominal tenderness.     Comments: Right-sided flank tenderness with overlying ecchymosis  Musculoskeletal:        General: No swelling.     Cervical back: Neck supple.     Comments: Slight midline tenderness of cervical spine.  No paraspinal tenderness noted bilaterally.  No midline tenderness of thoracic and lumbar spine  without step-off or deformity.  Right-sided chest wall tenderness to palpation without obvious deformity.  Tender palpation left proximal humerus with guarding of left shoulder.  Also tenderness middle aspect of left forearm.  Tender to palpation right shoulder as well.  Limited range of motion but patient be secondary to pain.  Symmetric grip strength as well as sensation bilateral upper and lower extremities.  Radial pedal pulses 2+ bilaterally.  Patient with tenderness anterior right knee with overlying swelling.  Otherwise, no tenderness of lower extremities.  Skin:    General: Skin is warm and dry.     Capillary Refill: Capillary refill takes less than 2 seconds.   Neurological:     Mental Status: She is alert.  Psychiatric:        Mood and Affect: Mood normal.     ED Results / Procedures / Treatments   Labs (all labs ordered are listed, but only abnormal results are displayed) Labs Reviewed  COMPREHENSIVE METABOLIC PANEL - Abnormal; Notable for the following components:      Result Value   Glucose, Bld 105 (*)    All other components within normal limits  I-STAT VENOUS BLOOD GAS, ED - Abnormal; Notable for the following components:   pO2, Ven 18 (*)    Bicarbonate 28.9 (*)    HCT 49.0 (*)    Hemoglobin 16.7 (*)    All other components within normal limits  I-STAT CHEM 8, ED    EKG EKG Interpretation Date/Time:  Thursday August 19 2023 14:53:04 EST Ventricular Rate:  73 PR Interval:  139 QRS Duration:  110 QT Interval:  413 QTC Calculation: 456 R Axis:   -22  Text Interpretation: Sinus rhythm Probable left atrial enlargement Borderline left axis deviation Low voltage, precordial leads RSR' in V1 or V2, probably normal variant Minimal ST elevation, diffuse leads No previous ECGs available Confirmed by Elayne Snare ([id number]) on 08/19/2023 2:54:55 PM  Radiology DG Shoulder Right  Result Date: 08/19/2023 CLINICAL DATA:  Tripped over dog, fall.  Pain. EXAM: RIGHT SHOULDER - 2+ VIEW COMPARISON:  Report from chest CT earlier today FINDINGS: There is no evidence of fracture or dislocation. The bones are subjectively under mineralized. There is mild acromioclavicular degenerative spurring. Slight superior subluxation of the humeral head. Soft tissue calcification is seen lateral to the humeral head in the region of the rotator cuff insertion. IMPRESSION: 1. No fracture or dislocation of the right shoulder. 2. Mild acromioclavicular degenerative spurring. 3. Soft tissue calcification lateral to the humeral head in the region of the rotator cuff insertion, may represent calcific tendinitis. Electronically Signed   By: Narda Rutherford M.D.    On: 08/19/2023 18:42   DG Knee Complete 4 Views Right  Result Date: 08/19/2023 CLINICAL DATA:  Tripped over dog.  Right knee pain. EXAM: RIGHT KNEE - COMPLETE 4+ VIEW COMPARISON:  None Available. FINDINGS: No evidence of fracture, dislocation, or joint effusion. The bones are subjectively under mineralized. Mild osteoarthritis with peripheral spurring and medial tibiofemoral joint space narrowing. Soft tissues are unremarkable. IMPRESSION: 1. No fracture or subluxation of the right knee. 2. Mild osteoarthritis. Electronically Signed   By: Narda Rutherford M.D.   On: 08/19/2023 18:40   DG Forearm Left  Result Date: 08/19/2023 CLINICAL DATA:  Tripped over dog, falling onto left arm. Pain. EXAM: LEFT FOREARM - 2 VIEW COMPARISON:  None Available. FINDINGS: There is no evidence of fracture or other focal bone lesions. Wrist and elbow alignment are maintained. Soft tissues are unremarkable.  IMPRESSION: Negative radiographs of the left forearm. Electronically Signed   By: Narda Rutherford M.D.   On: 08/19/2023 18:39   DG Shoulder Left  Result Date: 08/19/2023 CLINICAL DATA:  Tripped over dog, falling onto left arm.  Pain. EXAM: LEFT SHOULDER - 2+ VIEW COMPARISON:  Reformats from CT earlier today. FINDINGS: Nondisplaced fracture of the left proximal humerus, dominant fracture plane involving the surgical neck. No intra-articular involvement. Potential involvement of the lateral humeral head. No additional fracture. Normal glenohumeral alignment. Mild acromioclavicular degenerative change. IMPRESSION: Nondisplaced fracture of the left proximal humerus, dominant fracture plane involving the surgical neck. Electronically Signed   By: Narda Rutherford M.D.   On: 08/19/2023 18:39   CT CHEST ABDOMEN PELVIS W CONTRAST  Result Date: 08/19/2023 CLINICAL DATA:  Fall after tripping over dog with loss of consciousness EXAM: CT CHEST, ABDOMEN, AND PELVIS WITH CONTRAST TECHNIQUE: Multidetector CT imaging of the chest,  abdomen and pelvis was performed following the standard protocol during bolus administration of intravenous contrast. RADIATION DOSE REDUCTION: This exam was performed according to the departmental dose-optimization program which includes automated exposure control, adjustment of the mA and/or kV according to patient size and/or use of iterative reconstruction technique. CONTRAST:  85mL OMNIPAQUE IOHEXOL 300 MG/ML  SOLN COMPARISON:  CT chest and CT abdomen and pelvis dated 03/17/2023 FINDINGS: CT CHEST FINDINGS Cardiovascular: Normal heart size. No significant pericardial fluid/thickening. Great vessels are normal in course and caliber. No central pulmonary emboli. Mediastinum/Nodes: Imaged thyroid gland without nodules meeting criteria for imaging follow-up by size. Normal esophagus. No pathologically enlarged axillary, supraclavicular, mediastinal, or hilar lymph nodes. Lungs/Pleura: The central airways are patent. Predominant paraseptal emphysema. Unchanged right pulmonary nodules measuring up to 7 mm in the subpleural right lower lobe (4:81). No pneumothorax. No pleural effusion. Musculoskeletal: Nondisplaced fracture of the left humeral neck. Cortical undulation of the distal right clavicle (4:8), unchanged, may represent sequela of remote injury. Multilevel degenerative changes of the thoracic spine. CT ABDOMEN PELVIS FINDINGS Hepatobiliary: Subcentimeter segment 7 hypodensity, too small to characterize but likely cyst. No intra or extrahepatic biliary ductal dilation. Cholecystectomy. Pancreas: No focal lesions or main ductal dilation. Spleen: Normal in size without focal abnormality. Adrenals/Urinary Tract: No adrenal nodules. No suspicious renal mass or hydronephrosis. Punctate nonobstructing bilateral renal stones. Bladder diverticulum arising from the right posterior bladder. Stomach/Bowel: Normal appearance of the stomach. Mild mural thickening of the underdistended ascending and transverse colon. Normal  appendix. Vascular/Lymphatic: Aortic atherosclerosis. No enlarged abdominal or pelvic lymph nodes. Reproductive: No adnexal masses. Other: No free fluid, fluid collection, or free air. Musculoskeletal: No acute or abnormal lytic or blastic osseous findings. Changes of anterior abdominal mesh hernia repair. Multilevel degenerative changes of the lumbar spine. 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 IMPRESSION: 1. Nondisplaced fracture of the left humeral neck. 2. No evidence of acute traumatic injury in the abdomen or pelvis. 3. Mild mural thickening of the underdistended ascending and transverse colon, which may be related to underdistention or colitis. 4. Unchanged right pulmonary nodules measuring up to 7 mm in the subpleural right lower lobe. 5. Aortic Atherosclerosis (ICD10-I70.0) and Emphysema (ICD10-J43.9). Electronically Signed   By: Agustin Cree M.D.   On: 08/19/2023 18:30   CT Head Wo Contrast  Result Date: 08/19/2023 CLINICAL DATA:  Head trauma, moderate-severe; Neck trauma, midline tenderness (Age 52-64y) EXAM: CT HEAD WITHOUT CONTRAST CT CERVICAL SPINE WITHOUT CONTRAST TECHNIQUE: Multidetector CT imaging of the head and cervical spine was performed following the  standard protocol without intravenous contrast. Multiplanar CT image reconstructions of the cervical spine were also generated. RADIATION DOSE REDUCTION: This exam was performed according to the departmental dose-optimization program which includes automated exposure control, adjustment of the mA and/or kV according to patient size and/or use of iterative reconstruction technique. COMPARISON:  None Available. FINDINGS: CT HEAD FINDINGS Brain: Hemorrhage. No hydrocephalus. No extra-axial fluid collection. No CT evidence of an acute cortical infarct. No mass effect. No mass lesion. Background of mild chronic microvascular ischemic change. Vascular: No hyperdense vessel or unexpected calcification. Skull:  Soft tissue swelling along the right parietal scalp. No evidence of underlying calvarial fracture. Sinuses/Orbits: No middle ear or mastoid effusion. Paranasal sinuses are clear. Orbits are unremarkable. Other: None. CT CERVICAL SPINE FINDINGS Alignment: Normal. Skull base and vertebrae: No acute fracture. No primary bone lesion or focal pathologic process. Soft tissues and spinal canal: No prevertebral fluid or swelling. No visible canal hematoma. Disc levels:  No evidence of high-grade spinal canal stenosis. Upper chest: Negative. Other: None IMPRESSION: 1. No CT evidence of intracranial injury. 2. Soft tissue swelling along the right parietal scalp. No evidence of underlying calvarial fracture. 3. No acute fracture or traumatic subluxation of the cervical spine. Electronically Signed   By: Lorenza Cambridge M.D.   On: 08/19/2023 17:54   CT Cervical Spine Wo Contrast  Result Date: 08/19/2023 CLINICAL DATA:  Head trauma, moderate-severe; Neck trauma, midline tenderness (Age 56-64y) EXAM: CT HEAD WITHOUT CONTRAST CT CERVICAL SPINE WITHOUT CONTRAST TECHNIQUE: Multidetector CT imaging of the head and cervical spine was performed following the standard protocol without intravenous contrast. Multiplanar CT image reconstructions of the cervical spine were also generated. RADIATION DOSE REDUCTION: This exam was performed according to the departmental dose-optimization program which includes automated exposure control, adjustment of the mA and/or kV according to patient size and/or use of iterative reconstruction technique. COMPARISON:  None Available. FINDINGS: CT HEAD FINDINGS Brain: Hemorrhage. No hydrocephalus. No extra-axial fluid collection. No CT evidence of an acute cortical infarct. No mass effect. No mass lesion. Background of mild chronic microvascular ischemic change. Vascular: No hyperdense vessel or unexpected calcification. Skull: Soft tissue swelling along the right parietal scalp. No evidence of  underlying calvarial fracture. Sinuses/Orbits: No middle ear or mastoid effusion. Paranasal sinuses are clear. Orbits are unremarkable. Other: None. CT CERVICAL SPINE FINDINGS Alignment: Normal. Skull base and vertebrae: No acute fracture. No primary bone lesion or focal pathologic process. Soft tissues and spinal canal: No prevertebral fluid or swelling. No visible canal hematoma. Disc levels:  No evidence of high-grade spinal canal stenosis. Upper chest: Negative. Other: None IMPRESSION: 1. No CT evidence of intracranial injury. 2. Soft tissue swelling along the right parietal scalp. No evidence of underlying calvarial fracture. 3. No acute fracture or traumatic subluxation of the cervical spine. Electronically Signed   By: Lorenza Cambridge M.D.   On: 08/19/2023 17:54    Procedures Procedures    Medications Ordered in ED Medications  fentaNYL (SUBLIMAZE) injection 25 mcg (25 mcg Intravenous Given 08/19/23 1534)  iohexol (OMNIPAQUE) 300 MG/ML solution 100 mL (85 mLs Intravenous Contrast Given 08/19/23 1651)    ED Course/ Medical Decision Making/ A&P                                 Medical Decision Making Amount and/or Complexity of Data Reviewed Labs: ordered. Radiology: ordered.  Risk Prescription drug management.   This patient presents to  the ED for concern of fall, this involves an extensive number of treatment options, and is a complaint that carries with it a high risk of complications and morbidity.  The differential diagnosis includes CVA, fracture, strain/sprain, dislocation, ligamentous/tendinous injury, neurovascular compromise, spinal cord injury, other   Co morbidities that complicate the patient evaluation  See HPI   Additional history obtained:  Additional history obtained from EMR External records from outside source obtained and reviewed including hospital records   Lab Tests:  I Ordered, and personally interpreted labs.  The pertinent results include: No  electrolyte abnormalities.  No transaminitis.  No renal dysfunction.   Imaging Studies ordered:  I ordered imaging studies including CT head/cervical spine, right shoulder x-ray, left shoulder x-ray, left forearm x-ray, CT chest abdomen pelvis I independently visualized and interpreted imaging which showed  CT head/cervical spine: No acute intracranial abnormality.  Soft tissues swelling right parietal scalp with no evidence of underlying fracture.  No acute traumatic fracture or subluxation of cervical spine. Left shoulder x-ray: Nondisplaced fracture of left proximal humerus. Right shoulder x-ray: No acute osseous abnormality. Right knee x-ray: No acute osseous abnormality. Left forearm x-ray: No acute osseous abnormality. CT chest abdomen pelvis: Nondisplaced fracture of left humeral neck.  No evidence of acute traumatic injury of abdomen or pelvis.  Pulmonary nodules.  Aortic atherosclerosis. I agree with the radiologist interpretation  Cardiac Monitoring: / EKG:  The patient was maintained on a cardiac monitor.  I personally viewed and interpreted the cardiac monitored which showed an underlying rhythm of: Sinus rhythm   Consultations Obtained:  N/a   Problem List / ED Course / Critical interventions / Medication management  Fall, left humeral fracture I ordered medication including fentanyl  Reevaluation of the patient after these medicines showed that the patient improved I have reviewed the patients home medicines and have made adjustments as needed   Social Determinants of Health:  Cigarette use.  Denies illicit drug use.   Test / Admission - Considered:  Fall, left humeral fracture Vitals signs within normal range and stable throughout visit. Laboratory/imaging studies significant for: See above 61 year old female presents emergency department with mechanical fall when she tripped over her dog and fell.  Patient with numerous initial complaints with headache, neck  pain, chest pain, right knee pain, left arm pain, right shoulder pain, abdominal pain.  On exam, patient with obvious ecchymosis right flank area with right-sided chest wall tenderness, bilateral shoulder tenderness, right knee tenderness.  Imaging studies obtained of areas of appreciable traumatic injury and reproducible tenderness noted which were consistent with acute left-sided humeral surgical neck fracture with otherwise, imaging studies reassuring.  Regarding left-sided humeral neck fracture, patient placed in sling.  Recommended ice, ice, elevation, NSAIDs and follow-up with orthopedics in the outpatient setting.  Patient reassured by workup.  Will recommend follow-up with primary care in the outpatient setting for reassessment of symptoms.  Treatment plan discussed at length with patient and she knowledge understanding was agreeable to said plan.  Patient overall well-appearing, afebrile in no acute distress. Worrisome signs and symptoms were discussed with the patient, and the patient acknowledged understanding to return to the ED if noticed. Patient was stable upon discharge.          Final Clinical Impression(s) / ED Diagnoses Final diagnoses:  Fall, initial encounter  Closed nondisplaced fracture of surgical neck of left humerus, unspecified fracture morphology, initial encounter    Rx / DC Orders ED Discharge Orders  Ordered    ibuprofen (ADVIL) 600 MG tablet  Every 6 hours PRN,   Status:  Discontinued        08/19/23 1848    celecoxib (CELEBREX) 200 MG capsule  2 times daily PRN        08/19/23 1955              Peter Garter, [place] 08/19/23 2320    Elayne Snare K, DO 08/20/23 0825

## 2023-08-20 ENCOUNTER — Other Ambulatory Visit (HOSPITAL_BASED_OUTPATIENT_CLINIC_OR_DEPARTMENT_OTHER): Payer: Self-pay

## 2023-08-20 ENCOUNTER — Other Ambulatory Visit: Payer: Self-pay

## 2023-08-24 DIAGNOSIS — M25512 Pain in left shoulder: Secondary | ICD-10-CM | POA: Diagnosis not present

## 2023-08-24 DIAGNOSIS — S42295A Other nondisplaced fracture of upper end of left humerus, initial encounter for closed fracture: Secondary | ICD-10-CM | POA: Diagnosis not present

## 2023-08-30 ENCOUNTER — Other Ambulatory Visit (HOSPITAL_BASED_OUTPATIENT_CLINIC_OR_DEPARTMENT_OTHER): Payer: Self-pay

## 2023-09-01 ENCOUNTER — Other Ambulatory Visit: Payer: 59

## 2023-09-14 ENCOUNTER — Other Ambulatory Visit: Payer: Self-pay | Admitting: Internal Medicine

## 2023-09-14 DIAGNOSIS — R911 Solitary pulmonary nodule: Secondary | ICD-10-CM

## 2023-09-14 DIAGNOSIS — F411 Generalized anxiety disorder: Secondary | ICD-10-CM | POA: Diagnosis not present

## 2023-09-23 ENCOUNTER — Encounter: Payer: Self-pay | Admitting: Internal Medicine

## 2023-09-28 DIAGNOSIS — F411 Generalized anxiety disorder: Secondary | ICD-10-CM | POA: Diagnosis not present

## 2023-10-01 ENCOUNTER — Other Ambulatory Visit: Payer: 59

## 2023-10-05 DIAGNOSIS — F411 Generalized anxiety disorder: Secondary | ICD-10-CM | POA: Diagnosis not present

## 2023-10-19 DIAGNOSIS — F411 Generalized anxiety disorder: Secondary | ICD-10-CM | POA: Diagnosis not present

## 2023-10-26 DIAGNOSIS — F411 Generalized anxiety disorder: Secondary | ICD-10-CM | POA: Diagnosis not present

## 2023-11-02 DIAGNOSIS — F411 Generalized anxiety disorder: Secondary | ICD-10-CM | POA: Diagnosis not present

## 2023-11-08 ENCOUNTER — Ambulatory Visit
Admission: RE | Admit: 2023-11-08 | Discharge: 2023-11-08 | Disposition: A | Discharge status: Home / Self Care | Payer: 59 | Source: Ambulatory Visit | Attending: Internal Medicine | Admitting: Internal Medicine

## 2023-11-08 DIAGNOSIS — R918 Other nonspecific abnormal finding of lung field: Secondary | ICD-10-CM | POA: Diagnosis not present

## 2023-11-08 DIAGNOSIS — R911 Solitary pulmonary nodule: Secondary | ICD-10-CM

## 2023-11-16 DIAGNOSIS — F411 Generalized anxiety disorder: Secondary | ICD-10-CM | POA: Diagnosis not present

## 2023-11-18 DIAGNOSIS — F411 Generalized anxiety disorder: Secondary | ICD-10-CM | POA: Diagnosis not present

## 2023-12-21 DIAGNOSIS — F411 Generalized anxiety disorder: Secondary | ICD-10-CM | POA: Diagnosis not present

## 2023-12-30 ENCOUNTER — Ambulatory Visit: Payer: 59 | Admitting: Cardiology

## 2024-04-18 ENCOUNTER — Ambulatory Visit: Admitting: Cardiology

## 2024-07-05 ENCOUNTER — Encounter: Payer: Self-pay | Admitting: Internal Medicine

## 2024-07-05 ENCOUNTER — Ambulatory Visit: Attending: Cardiology | Admitting: Internal Medicine

## 2024-07-05 VITALS — BP 102/68 | HR 78 | Ht 64.0 in | Wt 152.8 lb

## 2024-07-05 DIAGNOSIS — Z8249 Family history of ischemic heart disease and other diseases of the circulatory system: Secondary | ICD-10-CM | POA: Diagnosis not present

## 2024-07-05 DIAGNOSIS — I452 Bifascicular block: Secondary | ICD-10-CM | POA: Diagnosis present

## 2024-07-05 DIAGNOSIS — R079 Chest pain, unspecified: Secondary | ICD-10-CM | POA: Insufficient documentation

## 2024-07-05 DIAGNOSIS — F17200 Nicotine dependence, unspecified, uncomplicated: Secondary | ICD-10-CM | POA: Insufficient documentation

## 2024-07-05 DIAGNOSIS — R0602 Shortness of breath: Secondary | ICD-10-CM | POA: Diagnosis present

## 2024-07-05 MED ORDER — METOPROLOL TARTRATE 50 MG PO TABS: 50.0000 mg | ORAL_TABLET | ORAL | 0 refills | Status: AC

## 2024-07-05 NOTE — Patient Instructions (Addendum)
 Medication Instructions:  TAKE one-time dose of metoprolol tartrate 2 hours before CT test  *If you need a refill on your cardiac medications before your next appointment, please call your pharmacy*  Lab Work: FASTING lab work before your CT test -- lipid panel, LPa, BMET  If you have labs (blood work) drawn today and your tests are completely normal, you will receive your results only by: MyChart Message (if you have MyChart) OR A paper copy in the mail If you have any lab test that is abnormal or we need to change your treatment, we will call you to review the results.  Testing/Procedures: Coronary CT --  you'll get a call to schedule this  Follow-Up: At [facility], you and your health needs are our priority.  As part of our continuing mission to provide you with exceptional heart care, our providers are all part of one team.  This team includes your primary Cardiologist (physician) and Advanced Practice Providers or APPs (Physician Assistants and Nurse Practitioners) who all work together to provide you with the care you need, when you need it.  Your next appointment:    4-6 weeks with Pamela Collins  We recommend signing up for the patient portal called MyChart.  Sign up information is provided on this After Visit Summary.  MyChart is used to connect with patients for Virtual Visits (Telemedicine).  Patients are able to view lab/test results, encounter notes, upcoming appointments, etc.  Non-urgent messages can be sent to your provider as well.   To learn more about what you can do with MyChart, go to [email or web address].   Other Instructions   Your cardiac CT will be scheduled at one of the below locations:   [facility] [address] [place], [place] [address] [phone] (Severe contrast allergies only)  OR   [facility] [address] [place], [place] [address] [phone]  OR   [facility] [address] [place], [place] [address] [phone]  OR   [facility] [address]  [place], [place] [address] [phone]  OR   [facility] [address] [place], [place] [phone]  If scheduled at [facility], please arrive at the [facility] Entrance (Entrance C2) of [facility] 30 minutes prior to test start time. You can use the FREE valet parking offered at entrance C (encouraged to control the heart rate for the test)  Proceed to the [facility] Radiology Department (first floor) to check-in and test prep.  All radiology patients and guests should use entrance C2 at [facility], accessed from [facility], even though the hospital's physical address listed is [address].  If scheduled at the Heart and Vascular Tower at [address], please enter the parking lot using the [address] entrance and use the FREE valet service at the patient drop-off area. Enter the building and check-in with registration on the main floor.  If scheduled at [facility], please arrive to the Heart and Vascular Center 15 mins early for check-in and test prep.  There is spacious parking and easy access to the radiology department from the [facility] Heart and Vascular entrance. Please enter here and check-in with the desk attendant.   If scheduled at [facility], please arrive 30 minutes early for check-in and test prep.  Please follow these instructions carefully (unless otherwise directed):  An IV will  be required for this test and Nitroglycerin will be given.  Hold all erectile dysfunction medications at least 3 days (72 hrs) prior to test. (Ie viagra, cialis, sildenafil, tadalafil, etc)   On the Night Before the Test: Be sure to Drink plenty of water. Do not consume any caffeinated/decaffeinated beverages or chocolate 12 hours prior to your test. Do not take  any antihistamines 12 hours prior to your test.   On the Day of the Test: Drink plenty of water until 1 hour prior to the test. Do not eat any food 1 hour prior to test. You may take your regular medications prior to the test.  Take metoprolol (Lopressor) two hours prior to test. If you take Furosemide/Hydrochlorothiazide/Spironolactone/Chlorthalidone, please HOLD on the morning of the test. Patients who wear a continuous glucose monitor MUST remove the device prior to scanning. FEMALES- please wear underwire-free bra if available, avoid dresses & tight clothing  After the Test: Drink plenty of water. After receiving IV contrast, you may experience a mild flushed feeling. This is normal. On occasion, you may experience a mild rash up to 24 hours after the test. This is not dangerous. If this occurs, you can take Benadryl 25 mg, Zyrtec, Claritin, or Allegra and increase your fluid intake. (Patients taking Tikosyn should avoid Benadryl, and may take Zyrtec, Claritin, or Allegra) If you experience trouble breathing, this can be serious. If it is severe call 911 IMMEDIATELY. If it is mild, please call our office.  We will call to schedule your test 2-4 weeks out understanding that some insurance companies will need an authorization prior to the service being performed.   For more information and frequently asked questions, please visit our website : [email or web address]  For non-scheduling related questions, please contact the cardiac imaging nurse navigator should you have any questions/concerns: Cardiac Imaging Nurse Navigators Direct Office Dial: [phone]   For scheduling needs, including cancellations and rescheduling, please call [place], [phone].

## 2024-07-05 NOTE — Progress Notes (Signed)
 OFFICE NOTE  Chief Complaint:  Chest pain  Primary Care Physician: Dwight Trula SQUIBB, MD  HPI:  Pamela Collins is a 63 y.o. female with a past medial history significant for chronic tobacco and former alcohol use, bipolar 1 disorder, depression, anxiety, prior hernia repair with ongoing abdominal pain and recent symptoms of chest pain.  The referral axis suggest she was sent for evaluation of abdominal pain however she reports she has been having some chest pain radiating down to her left arm.  This is sometimes worse with stress but could be seen with exertion.  She has a strong family history of heart disease in multiple family members.  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  An EKG was performed today which was abnormal indicating bifascicular block.  Compared to a prior EKG she has a new right bundle branch block.  This may be related to chronic lung disease.  PMHx:  Past Medical History:  Diagnosis Date   Basal cell carcinoma of nose    Bipolar I disorder (HCC)    Cleft hard palate    Depression    GAD (generalized anxiety disorder)    Obesity     Past Surgical History:  Procedure Laterality Date   ABDOMINAL HYSTERECTOMY     BREAST SURGERY     CHOLECYSTECTOMY     CLEFT PALATE REPAIR     19   HERNIA REPAIR     SKIN CANCER DESTRUCTION      FAMHx:  Family History  Problem Relation Age of Onset   Bipolar disorder Mother    Breast cancer Maternal Aunt    Breast cancer Maternal Aunt    Breast cancer Maternal Aunt    Breast cancer Paternal Aunt    Breast cancer Paternal Aunt    Mental illness Maternal Grandmother    Bipolar disorder Son     SOCHx:   reports that she has been smoking cigarettes. She has never used smokeless tobacco. She reports that she does not currently use alcohol. She reports that she does not use drugs.  ALLERGIES:  Allergies  Allergen Reactions   Guaifenesin Er Rash and  Nausea And Vomiting   Cefdinir    Codeine    Doxycycline Rash    ROS: Pertinent items noted in HPI and remainder of comprehensive ROS otherwise negative.  HOME MEDS: Current Outpatient Medications on File Prior to Visit  Medication Sig Dispense Refill   ALPRAZolam (XANAX) 1 MG tablet Take 1 mg by mouth 3 (three) times daily as needed for anxiety.     busPIRone (BUSPAR) 15 MG tablet Take 15 mg by mouth 3 (three) times daily. (Patient taking differently: Take 15 mg by mouth 2 (two) times daily.)     celecoxib  (CELEBREX ) 200 MG capsule Take 1 capsule (200 mg total) by mouth 2 (two) times daily as needed. 30 capsule 0   meloxicam (MOBIC) 15 MG tablet Take 15 mg by mouth daily.     sertraline (ZOLOFT) 100 MG tablet Take 100 mg by mouth daily.     traZODone (DESYREL) 100 MG tablet Take 100 mg by mouth at bedtime.     No current facility-administered medications on file prior to visit.    LABS/IMAGING: No results found for this or any previous visit (from the past 48 hours). No results found.  LIPID PANEL: No results found for: CHOL, TRIG, HDL, CHOLHDL, VLDL, LDLCALC, LDLDIRECT   WEIGHTS: Wt Readings  from Last 3 Encounters:  07/05/24 152 lb 12.8 oz (69.3 kg)  08/19/23 158 lb (71.7 kg)    VITALS: BP 102/68   Pulse 78   Ht 5' 4 (1.626 m)   Wt 152 lb 12.8 oz (69.3 kg)   SpO2 98%   BMI 26.23 kg/m   EXAM: General appearance: alert and no distress Lungs: diminished breath sounds bilaterally Heart: regular rate and rhythm Extremities: extremities normal, atraumatic, no cyanosis or edema Neurologic: Grossly normal  EKG: EKG Interpretation Date/Time:  Wednesday July 05 2024 16:36:01 EDT Ventricular Rate:  78 PR Interval:  130 QRS Duration:  122 QT Interval:  402 QTC Calculation: 458 R Axis:   -50  Text Interpretation: Normal sinus rhythm Right bundle branch block Left anterior fascicular block Bifascicular block When compared with ECG of 19-Aug-2023  14:53, Compared to previous tracing an RBB is now present Confirmed by Pamela Collins [phone]) on 07/05/2024 4:45:44 PM - personally reviewed  ASSESSMENT: Chest pain, possible angina Tobacco use with COPD Strong family history of cardiovascular disease Abnormal EKG with bifascicular block  PLAN: 1.   Pamela Collins is complaining of some left-sided chest pain radiating to her arm which could be anginal.  She is a smoker with COPD and but does have a strong family history of cardiovascular disease.  Her EKG is abnormal and more recently is showing a right bundle branch block in addition to a left anterior fascicular block.  There could be underlying coronary disease.  I have advised a coronary CT angiogram to further evaluate her chest pain.  Will reach out to her with the results of that study and further adjustments as necessary.  As part of the workup I would advise also reassessing lipids including NMR and LP(a) as well as renal function.  Thanks again for the kind referral.  Collins KYM Mona, MD, [facility], FNLA, FACP  [place]  [facility]  Medical Director of the Advanced Lipid Disorders &  Cardiovascular Risk Reduction Clinic Diplomate of the American Board of Clinical Lipidology Attending Cardiologist  Direct Dial: [phone]  Fax: [phone]  Website:  [email or web address]   Pamela Collins 07/05/2024, 5:22 PM

## 2024-09-06 ENCOUNTER — Encounter (HOSPITAL_COMMUNITY): Payer: Self-pay
